# Patient Record
Sex: Male | Born: 1945 | Race: White | Hispanic: No | Marital: Married | Smoking: Former smoker
Health system: Southern US, Community
[De-identification: ages and names within clinical notes are randomized; demographics above are authoritative.]

## PROBLEM LIST (undated history)

## (undated) DIAGNOSIS — M1990 Unspecified osteoarthritis, unspecified site: Secondary | ICD-10-CM

## (undated) DIAGNOSIS — Z86010 Personal history of colonic polyps: Secondary | ICD-10-CM

## (undated) DIAGNOSIS — I10 Essential (primary) hypertension: Secondary | ICD-10-CM

## (undated) HISTORY — DX: Personal history of colonic polyps: Z86.010

## (undated) HISTORY — PX: COLONOSCOPY: SHX174

## (undated) HISTORY — DX: Essential (primary) hypertension: I10

## (undated) HISTORY — PX: KNEE SURGERY: SHX244

## (undated) HISTORY — DX: Unspecified osteoarthritis, unspecified site: M19.90

---

## 2003-07-07 ENCOUNTER — Other Ambulatory Visit: Payer: Self-pay

## 2004-08-24 ENCOUNTER — Ambulatory Visit: Payer: Self-pay | Admitting: General Practice

## 2005-09-14 ENCOUNTER — Ambulatory Visit: Payer: Self-pay | Admitting: General Surgery

## 2007-05-08 DIAGNOSIS — I10 Essential (primary) hypertension: Secondary | ICD-10-CM

## 2007-05-08 HISTORY — DX: Essential (primary) hypertension: I10

## 2009-11-25 ENCOUNTER — Ambulatory Visit: Payer: Self-pay | Admitting: Internal Medicine

## 2010-09-26 ENCOUNTER — Ambulatory Visit: Payer: Self-pay | Admitting: General Surgery

## 2010-09-28 LAB — PATHOLOGY REPORT

## 2011-06-18 ENCOUNTER — Ambulatory Visit: Payer: Self-pay | Admitting: Internal Medicine

## 2012-11-20 ENCOUNTER — Encounter: Payer: Self-pay | Admitting: *Deleted

## 2013-04-29 ENCOUNTER — Ambulatory Visit: Payer: Self-pay | Admitting: General Practice

## 2013-04-29 DIAGNOSIS — I10 Essential (primary) hypertension: Secondary | ICD-10-CM

## 2013-04-29 LAB — CBC
HCT: 45.1 % (ref 40.0–52.0)
HGB: 14.6 g/dL (ref 13.0–18.0)
MCH: 28.9 pg (ref 26.0–34.0)
Platelet: 264 10*3/uL (ref 150–440)
RBC: 5.05 10*6/uL (ref 4.40–5.90)
WBC: 9.2 10*3/uL (ref 3.8–10.6)

## 2013-04-29 LAB — BASIC METABOLIC PANEL
Anion Gap: 3 — ABNORMAL LOW (ref 7–16)
BUN: 14 mg/dL (ref 7–18)
Calcium, Total: 8.6 mg/dL (ref 8.5–10.1)
Co2: 30 mmol/L (ref 21–32)
EGFR (Non-African Amer.): >60
Glucose: 104 mg/dL — ABNORMAL HIGH (ref 65–99)
Osmolality: 269 (ref 275–301)
Potassium: 4 mmol/L (ref 3.5–5.1)
Sodium: 134 mmol/L — ABNORMAL LOW (ref 136–145)

## 2013-04-29 LAB — URINALYSIS, COMPLETE
Bacteria: NONE SEEN
Glucose,UR: NEGATIVE mg/dL (ref 0–75)
Hyaline Cast: 1
Ketone: NEGATIVE
Nitrite: NEGATIVE
Ph: 6 (ref 4.5–8.0)
RBC,UR: 10 /HPF (ref 0–5)
Specific Gravity: 1.01 (ref 1.003–1.030)
Squamous Epithelial: 1
WBC UR: 1 /HPF (ref 0–5)

## 2013-04-29 LAB — PROTIME-INR: INR: 0.9

## 2013-04-29 LAB — APTT: Activated PTT: 33.3 secs (ref 23.6–35.9)

## 2013-05-11 ENCOUNTER — Inpatient Hospital Stay: Payer: Self-pay | Admitting: General Practice

## 2013-05-12 LAB — BASIC METABOLIC PANEL
ANION GAP: 3 — AB (ref 7–16)
BUN: 12 mg/dL (ref 7–18)
Calcium, Total: 8.2 mg/dL — ABNORMAL LOW (ref 8.5–10.1)
Chloride: 100 mmol/L (ref 98–107)
Co2: 28 mmol/L (ref 21–32)
Creatinine: 0.96 mg/dL (ref 0.60–1.30)
GLUCOSE: 109 mg/dL — AB (ref 65–99)
OSMOLALITY: 263 (ref 275–301)
Potassium: 3.9 mmol/L (ref 3.5–5.1)
Sodium: 131 mmol/L — ABNORMAL LOW (ref 136–145)

## 2013-05-12 LAB — HEMOGLOBIN: HGB: 12.1 g/dL — AB (ref 13.0–18.0)

## 2013-05-12 LAB — PLATELET COUNT: PLATELETS: 203 10*3/uL (ref 150–440)

## 2013-05-13 LAB — BASIC METABOLIC PANEL
Anion Gap: 2 — ABNORMAL LOW (ref 7–16)
BUN: 11 mg/dL (ref 7–18)
CALCIUM: 8.4 mg/dL — AB (ref 8.5–10.1)
CO2: 32 mmol/L (ref 21–32)
CREATININE: 1.05 mg/dL (ref 0.60–1.30)
Chloride: 102 mmol/L (ref 98–107)
EGFR (African American): >60
EGFR (Non-African Amer.): >60
GLUCOSE: 107 mg/dL — AB (ref 65–99)
OSMOLALITY: 272 (ref 275–301)
Potassium: 3.8 mmol/L (ref 3.5–5.1)
Sodium: 136 mmol/L (ref 136–145)

## 2013-05-13 LAB — HEMOGLOBIN: HGB: 12.6 g/dL — ABNORMAL LOW (ref 13.0–18.0)

## 2013-05-13 LAB — PLATELET COUNT: PLATELETS: 207 10*3/uL (ref 150–440)

## 2014-03-23 ENCOUNTER — Ambulatory Visit: Payer: Self-pay | Admitting: General Practice

## 2014-03-23 LAB — CBC
HCT: 43 % (ref 40.0–52.0)
HGB: 14.6 g/dL (ref 13.0–18.0)
MCH: 30.6 pg (ref 26.0–34.0)
MCHC: 33.9 g/dL (ref 32.0–36.0)
MCV: 90 fL (ref 80–100)
Platelet: 254 10*3/uL (ref 150–440)
RBC: 4.77 10*6/uL (ref 4.40–5.90)
RDW: 13.6 % (ref 11.5–14.5)
WBC: 7.6 10*3/uL (ref 3.8–10.6)

## 2014-03-23 LAB — URINALYSIS, COMPLETE
BACTERIA: NONE SEEN
Bilirubin,UR: NEGATIVE
Blood: NEGATIVE
Glucose,UR: NEGATIVE mg/dL (ref 0–75)
Ketone: NEGATIVE
Leukocyte Esterase: NEGATIVE
NITRITE: NEGATIVE
Ph: 7 (ref 4.5–8.0)
Protein: NEGATIVE
Specific Gravity: 1.006 (ref 1.003–1.030)
Squamous Epithelial: <1
WBC UR: <1 /HPF (ref 0–5)

## 2014-03-23 LAB — PROTIME-INR
INR: 1
Prothrombin Time: 13.2 secs (ref 11.5–14.7)

## 2014-03-23 LAB — BASIC METABOLIC PANEL
Anion Gap: 6 — ABNORMAL LOW (ref 7–16)
BUN: 17 mg/dL (ref 7–18)
Calcium, Total: 9.1 mg/dL (ref 8.5–10.1)
Chloride: 102 mmol/L (ref 98–107)
Co2: 28 mmol/L (ref 21–32)
Creatinine: 1.13 mg/dL (ref 0.60–1.30)
EGFR (African American): >60
GLUCOSE: 107 mg/dL — AB (ref 65–99)
OSMOLALITY: 274 (ref 275–301)
POTASSIUM: 4.1 mmol/L (ref 3.5–5.1)
SODIUM: 136 mmol/L (ref 136–145)

## 2014-03-23 LAB — SEDIMENTATION RATE: ERYTHROCYTE SED RATE: 10 mm/h (ref 0–20)

## 2014-03-23 LAB — APTT: Activated PTT: 31 secs (ref 23.6–35.9)

## 2014-03-23 LAB — MRSA PCR SCREENING

## 2014-03-24 LAB — URINE CULTURE

## 2014-04-07 ENCOUNTER — Inpatient Hospital Stay: Payer: Self-pay | Admitting: General Practice

## 2014-04-08 LAB — BASIC METABOLIC PANEL
Anion Gap: 9 (ref 7–16)
BUN: 16 mg/dL (ref 7–18)
CALCIUM: 7.8 mg/dL — AB (ref 8.5–10.1)
Chloride: 96 mmol/L — ABNORMAL LOW (ref 98–107)
Co2: 26 mmol/L (ref 21–32)
Creatinine: 1.13 mg/dL (ref 0.60–1.30)
EGFR (Non-African Amer.): >60
Glucose: 155 mg/dL — ABNORMAL HIGH (ref 65–99)
OSMOLALITY: 267 (ref 275–301)
POTASSIUM: 3.6 mmol/L (ref 3.5–5.1)
Sodium: 131 mmol/L — ABNORMAL LOW (ref 136–145)

## 2014-04-08 LAB — PLATELET COUNT: Platelet: 220 10*3/uL (ref 150–440)

## 2014-04-08 LAB — HEMOGLOBIN: HGB: 12.2 g/dL — ABNORMAL LOW (ref 13.0–18.0)

## 2014-04-09 LAB — BASIC METABOLIC PANEL
ANION GAP: 9 (ref 7–16)
BUN: 12 mg/dL (ref 7–18)
Calcium, Total: 7.8 mg/dL — ABNORMAL LOW (ref 8.5–10.1)
Chloride: 97 mmol/L — ABNORMAL LOW (ref 98–107)
Co2: 26 mmol/L (ref 21–32)
Creatinine: 1.18 mg/dL (ref 0.60–1.30)
EGFR (African American): >60
EGFR (Non-African Amer.): >60
GLUCOSE: 167 mg/dL — AB (ref 65–99)
Osmolality: 268 (ref 275–301)
Potassium: 3.4 mmol/L — ABNORMAL LOW (ref 3.5–5.1)
Sodium: 132 mmol/L — ABNORMAL LOW (ref 136–145)

## 2014-04-09 LAB — PLATELET COUNT: Platelet: 197 10*3/uL (ref 150–440)

## 2014-04-09 LAB — HEMOGLOBIN: HGB: 11.6 g/dL — ABNORMAL LOW (ref 13.0–18.0)

## 2014-04-10 LAB — BASIC METABOLIC PANEL
ANION GAP: 5 — AB (ref 7–16)
BUN: 12 mg/dL (ref 7–18)
CALCIUM: 7.8 mg/dL — AB (ref 8.5–10.1)
Chloride: 101 mmol/L (ref 98–107)
Co2: 27 mmol/L (ref 21–32)
Creatinine: 1.02 mg/dL (ref 0.60–1.30)
EGFR (African American): >60
Glucose: 131 mg/dL — ABNORMAL HIGH (ref 65–99)
OSMOLALITY: 268 (ref 275–301)
POTASSIUM: 3.7 mmol/L (ref 3.5–5.1)
Sodium: 133 mmol/L — ABNORMAL LOW (ref 136–145)

## 2014-08-28 NOTE — Op Note (Signed)
PATIENT NAME:  Jerry Walls, PINN MR#:  [id number] DATE OF BIRTH:  [birth year]  DATE OF PROCEDURE:  05/11/2013  PREOPERATIVE DIAGNOSIS: Degenerative arthrosis of the left knee.   POSTOPERATIVE DIAGNOSIS: Degenerative arthrosis of the left knee.   PROCEDURE PERFORMED: Left total knee arthroplasty using computer-assisted navigation.   SURGEON: Laurice Record. Hooten, MD   ASSISTANT: Vance Peper, PA (required to maintain retraction throughout the procedure).   ANESTHESIA: Spinal.   ESTIMATED BLOOD LOSS: 50 mL.   FLUIDS REPLACED: 1800 mL of crystalloid.   TOURNIQUET TIME: 98 minutes.   DRAINS: Two medium drains to reinfusion system.   SOFT TISSUE RELEASES: Anterior cruciate ligament, posterior cruciate ligament, deep and superficial medial collateral ligament, and patellofemoral ligament.   IMPLANTS UTILIZED: DePuy PFC Sigma size 5 posterior stabilized femoral component (cemented), size 5 MBT tibial component (cemented), 38 mm 3-peg oval dome patella (cemented), and a 10 mm stabilized rotating platform polyethylene insert.   INDICATIONS FOR SURGERY: The patient is a 69 year old gentleman who has been seen for complaints of progressive left knee pain. X-rays demonstrated severe degenerative changes in tricompartmental fashion with relative varus deformity. After discussion of the risks and benefits of surgical intervention, the patient expressed understanding of the risks and benefits and agreed with plans for surgical intervention.   PROCEDURE IN DETAIL: The patient was brought into the operating room and after adequate spinal anesthesia was achieved, a tourniquet was placed on the patient's upper left thigh. The patient's left knee and leg were cleaned and prepped with alcohol and DuraPrep and draped in the usual sterile fashion. A "timeout" was performed as per usual protocol. The left lower extremity was exsanguinated using an Esmarch, and the tourniquet was inflated to 300 mmHg. An anterior  longitudinal incision was made followed by a standard mid vastus approach. A moderate effusion was evacuated. The deep fibers of the medial collateral ligament were elevated in a subperiosteal fashion off the medial flare of the tibia so as to maintain a continuous soft tissue sleeve. The patella was subluxed laterally and the patellofemoral ligament was incised. Inspection of the knee demonstrated severe degenerative changes with full-thickness loss of articular cartilage to the medial compartment. Prominent osteophytes were debrided using a rongeur. Anterior and posterior cruciate ligaments were excised. Two 4.0 mm Schanz pins were inserted into the femur and into the tibia for attachment of the array of trackers used for computer-assisted navigation. Hip center was identified using circumduction technique. Distal landmarks were mapped using the computer. The distal femur and proximal tibia were mapped using the computer. Distal femoral cutting guide was then positioned using computer-assisted navigation so as to achieve a 5 degree distal valgus cut. Cut was performed and verified using the computer. Distal femur was sized and it was felt that a size 5 femoral component was appropriate. A size 5 cutting guide was positioned and the anterior cut was performed and verified using the computer. This was followed by completion of the posterior and chamfer cuts. Femoral cutting guide for central box was then positioned, and a central box cut was performed. Attention was then directed to the proximal tibia. Medial and lateral menisci were excised. The extramedullary tibial cutting guide was positioned using computer-assisted navigation so as to achieve 0 degree varus-valgus alignment and 0 degree posterior slope. Cut was performed and verified using the computer. The proximal tibia was sized and it was felt that a size 5 tibial tray was appropriate. Tibial and femoral trials were inserted followed by debridement of  prominent posterior osteophytes off of the femoral condyles. A 10 mm polyethylene insert was placed and the knee was placed through a range of motion. The knee was felt to be tight medially. A Cobb elevator was used to elevate the superficial fibers of the medial collateral ligament. This allowed for excellent medial and lateral soft tissue balancing both in full extension and in flexion. Finally, the patella was cut and prepared so as to accommodate a 38 mm 3-peg oval dome patella. Patellar trial was placed and the knee was placed through a range of motion with excellent patellar tracking appreciated. The femoral trial was removed. Central post hole for the tibial component was reamed followed by insertion of a keel punch. Tibial trials were then removed. The cut surfaces of bone were irrigated with copious amounts of normal saline with antibiotic solution using pulsatile lavage and then suctioned dry. Polymethylmethacrylate cement was prepared in the usual fashion using a vacuum mixer. Cement was applied to the cut surface of the proximal tibia as well as to the undersurface of a size 5 MBT tibial component. The tibial component was positioned and impacted into place. Excess cement was removed using [employer] fast streamer. Cement was then applied to cut surfaces of the femur as well as along the posterior flanges of a size 5 posterior stabilized femoral component. Femoral component was positioned and impacted into place. Excess cement was removed using [employer] fast streamer. A 10 mm polyethylene trial was placed and the knee was brought into full extension with steady axial compression applied. Finally, cement was applied to the backside of a 38 mm 3-peg oval dome patella and patellar component was positioned and patellar clamp applied. Excess cement was removed using [employer] fast streamer.   After adequate curing of cement, the tourniquet was deflated after a total tourniquet time of 98 minutes. Hemostasis was achieved using  electrocautery. The knee was irrigated with copious amounts of normal saline with antibiotic solution using pulsatile lavage and then suctioned dry. The knee was inspected for any residual cement debris. 20 mL of 1.3% Exparel in 40 mL of normal saline was injected along the posterior capsule, medial and lateral gutters, and along the arthrotomy site. A 10 mm stabilized rotating platform polyethylene insert was inserted and the knee was placed through a range of motion with excellent medial and lateral soft tissue balance appreciated and excellent patellar tracking appreciated. Two medium drains were placed in the wound bed and brought out through a separate stab incision to be attached to a reinfusion system. The medial parapatellar portion of the incision was reapproximated using interrupted sutures of #1 Vicryl. The subcutaneous tissue was injected along the incision site with 0.25% Marcaine with epinephrine. The subcutaneous tissue was then approximated in layers using first #0 Vicryl followed by 2-0 Vicryl. The skin was closed with skin staples. A sterile dressing was applied.  The patient tolerated the procedure well. He was transported to the recovery room in stable condition.    ____________________________ Laurice Record. Holley Bouche., MD jph:jcm D: 05/11/2013 19:57:14 ET T: 05/11/2013 20:35:56 ET JOB#: [id number]  cc: Laurice Record. Holley Bouche., MD, <Dictator> JAMES P Holley Bouche MD ELECTRONICALLY SIGNED 05/24/2013 23:41

## 2014-08-28 NOTE — Op Note (Signed)
PATIENT NAME:  Jerry Walls, Jerry Walls MR#:  [id number] DATE OF BIRTH:  31-Dec-1945  DATE OF PROCEDURE:  04/07/2014  PREOPERATIVE DIAGNOSIS: Degenerative arthrosis of the right knee (primary).   POSTOPERATIVE DIAGNOSIS: Degenerative arthrosis of the right knee (primary).   PROCEDURE PERFORMED: Right total knee arthroplasty using computer-assisted navigation.   SURGEON: Laurice Record. Holley Bouche., MD.   ASSISTANTMarland Kitchen Vance Peper, PA (required to maintain retraction throughout the procedure).   ANESTHESIA: Spinal.   ESTIMATED BLOOD LOSS: 50 mL.   FLUIDS REPLACED: 1700 mL of crystalloid.   TOURNIQUET TIME: 114 minutes.   DRAINS: Two medium drains to reinfusion system.   SOFT TISSUE RELEASES: Anterior cruciate ligament, posterior cruciate ligament, deep and superficial medial collateral ligament, and patellofemoral ligament.   IMPLANTS UTILIZED: DePuy Sigma size 5 posterior stabilized femoral component (cemented), size 5 MBT tibial component (cemented), 41 mm 3-peg oval dome patella (cemented), and a 10 mm stabilized rotating platform polyethylene insert.   INDICATIONS FOR SURGERY: The patient is a 69 year old male who has been seen for complaints of progressive right knee pain and decreased range of motion. X-rays demonstrated severe degenerative changes in tricompartmental fashion with varus deformity. After discussion of the risks and benefits of surgical intervention, the patient expressed understanding of the risks and benefits, and agreed with plans for surgical intervention.   PROCEDURE IN DETAIL: The patient was brought to the Operating Room and, after adequate spinal anesthesia was achieved, a tourniquet was placed on the patient's upper right thigh. The patient's right knee and leg were cleaned and prepped with alcohol and DuraPrep, draped in the usual sterile fashion. A "timeout" was performed as per usual protocol. The right lower extremity was exsanguinated using an Esmarch, and the tourniquet was  inflated to 300 mmHg.   An anterior and longitudinal incision was made followed by a standard mid vastus approach. A large effusion was evacuated. The deep fibers of the medial collateral ligament were elevated in a subperiosteal fashion off the medial flare of the tibia so as to maintain a continuous soft tissue sleeve. The patella was subluxed laterally and the patellofemoral ligament was incised. Osteophytes were debrided off the patella. Inspection of the knee demonstrated severe degenerative changes in tricompartmental fashion with full-thickness loss of articular cartilage and some erosive changes to the medial tibial plateau. Prominent osteophytes were debrided using a rongeur. Anterior and posterior cruciate ligaments were excised. Two 4.0 mm Schanz pins were inserted into the femur and into the tibia for attachment of the array of trackers used for computer-assisted navigation. Hip center was identified using circumduction technique. Distal landmarks were mapped using the computer. The distal femur and proximal tibia were mapped using the computer. Distal femoral cutting guide was positioned using computer-assisted navigation so as to achieve a 5 degree distal valgus cut. The cut was performed and verified using the computer. The distal femur was sized and it was felt that a size 5 femoral component was appropriate. A size 5 cutting guide was positioned and the anterior cut was performed and verified using the computer. This was followed by completion of the posterior and chamfer cuts. Femoral cutting guide for the central box was then positioned and the central box cut was performed.   Attention was then directed to the proximal tibia. Medial and lateral menisci were excised. The extramedullary tibial cutting guide was positioned using computer-assisted navigation so as to achieve 0 degree varus valgus alignment and 0 degree posterior slope. Cut was performed and verified using the computer. A  prominent posterior osteophyte was noted and this was debrided with the cut portion of the proximal tibia. Several relatively large osteochondral loose bodies were removed from the popliteal region. The proximal tibia was sized and it was felt that a size 5 tibial tray was appropriate. Tibial and femoral trials were inserted followed by insertion of a 10 mm polyethylene trial. The knee was felt to be tight medially. A Cobb elevator was used to elevate the superficial fibers of the medial collateral ligament. This allowed for excellent mediolateral soft tissue balancing both in full extension and in flexion. Finally, the patella was cut and prepared so as to accommodate a 41 mm 3-peg oval dome patella. Patellar trial was placed and the knee was placed through a range of motion with excellent patellar tracking appreciated. Greater than 120 degrees of flexion was achieved without difficulty. Femoral component was removed after debridement of the osteophytes off the posterior condyles. Central post hole for the tibial component was reamed followed by insertion of a keel punch. Tibial trials were then removed. The cut surfaces of bone were irrigated with copious amounts of normal saline with antibiotic solution using pulsatile lavage and then suctioned dry. Polymethyl methacrylate cement was prepared in the usual fashion using a vacuum mixer. Cement was applied to the cut surface of the proximal tibia as well as along the undersurface of a size 5 MBT tibial component. The tibial component was positioned and impacted into place. Excess cement was removed using freer elevators. Cement was then applied to the cut surface of the femur as well as on the posterior flanges of a size 5 posterior stabilized femoral component. Femoral component was positioned and impacted into place. Excess cement was removed using freer elevators. A 10 mm polyethylene trial was inserted and the knee was brought into full extension with steady  axial compression applied. Finally, cement was applied to the backside of a 41 mm 3-peg oval dome patella, and the patellar component was positioned and patellar clamp applied. Excess cement was removed using freer elevators.   After adequate curing of cement, the tourniquet was deflated after a total tourniquet time of 114 minutes. Hemostasis was achieved using electrocautery. The knee was irrigated with copious amounts of normal saline with antibiotic solution using pulsatile lavage and then suctioned dry. The knee was inspected for any residual cement debris. Then, 20 mL of 1.3% Exparel in 40 mL of normal saline was injected along the posterior capsule, medial and lateral gutters, and along the arthrotomy site. A 10 mm stabilized rotating platform polyethylene insert was inserted and the knee was placed through a range of motion with excellent mediolateral soft tissue balancing and excellent patellar tracking appreciated. Two medium drains were placed in the wound bed and brought out through a separate stab incision to be attached to a reinfusion system. The medial parapatellar portion of the incision was reapproximated using interrupted sutures of #1 Vicryl. The subcutaneous tissue was approximated in layers using first #0 Vicryl followed by #2-0 Vicryl. Then, 30 mL of 0.25% Marcaine with epinephrine was injected along the subcutaneous tissue in line with the skin incision. The skin was reapproximated using skin staples. A sterile dressing was applied.   The patient tolerated the procedure well. He was transported to the recovery room in stable condition.    ____________________________ Laurice Record. Holley Bouche., MD jph:at D: 04/08/2014 06:54:24 ET T: 04/08/2014 13:02:31 ET JOB#: [id number]  cc: Laurice Record. Holley Bouche., MD, <Dictator> JAMES P Holley Bouche MD ELECTRONICALLY SIGNED  04/09/2014 12:57 

## 2014-08-28 NOTE — Discharge Summary (Signed)
PATIENT NAME:  Jerry Walls, Jerry Walls MR#:  [id number] DATE OF BIRTH:  23-Jan-1946  DATE OF ADMISSION:  05/11/2013 DATE OF DISCHARGE:  05/14/2013  ADMITTING DIAGNOSIS: Degenerative arthrosis of the left knee.   DISCHARGE DIAGNOSES:  1.  Degenerative arthrosis of the left knee.  2.  Increased blood pressure, controlled with adjustment of medication.   CONSULTATION: Hospitalist, Dr. Anselm Jungling.   HISTORY: The patient is a 69 year old gentleman, who has a several year history of progressive left knee pain. The symptoms have been essentially increasing in severity over the course of the last several months. He has reported increased pain to the medial aspect of the knee. His pain was aggravated with weight-bearing activities. The patient had reported some near giving way of the knee as well as activity-related swelling. He was having difficulty with ascending and descending stairs as well as arising from a sitting position. The pain increased to a point that it was significantly interfering with his activities of daily living. X-rays taken in [place] showed severe tricompartmental degenerative arthrosis. He was noted to have bone-on-bone to the medial compartment space. There was some slight varus alignment noted. Subchondral sclerosis as well as osteophyte formation was noted. After discussion of the risks and benefits of surgical intervention, the patient expressed his understanding of the risks and benefits and agreed for plans for surgical intervention.   PROCEDURE: Left total knee arthroplasty using computer-assisted navigation.   ANESTHESIA: Spinal.   SOFT TISSUE RELEASE: Anterior cruciate ligament, posterior cruciate ligament, deep and superficial medial collateral ligaments, as well as patellofemoral ligament.   IMPLANTS UTILIZED: DePuy PFC Sigma size 5 posterior stabilized femoral component (cemented), size 5 MBT tibial component (cemented), 38 mm, 3-peg oval dome patella  (cemented), and a 10 mm stabilized rotating platform polyethylene insert.   HOSPITAL COURSE: The patient tolerated the procedure very well. He had no complications. He was then taken to the PACU where he was stabilized and then transferred to the orthopedic floor. He began receiving anticoagulation therapy of Lovenox 30 mg subcutaneous q.12 hours per anesthesia and pharmacy protocol. He was fitted with TED stockings bilaterally. These were allowed to be removed 1 hour per 8 hour shift. The left one was final day 2 following removal of the Hemovac and dressing change. The patient was also fitted with AVI compression foot pumps bilaterally set at 80 mmHg. His calves have been nontender. He has had negative Homans sign. There has been no evidence of any DVTs. Heels were elevated off the bed using rolled towels.   The patient has denied any chest pain or shortness of breath. Vital signs have been stable. His blood pressure, as stated earlier, was elevated during his hospital course and subsequently a medical consult was obtained and blood pressure medicine was adjusted and he has had no complications. He was asymptomatic during this time. The patient has been stable. No transfusions were needed other than the Autovac transfusion given the first 6 hours postoperatively.   The patient began receiving physical therapy on day 1 for gait training and transfers. He has been doing extremely well. Upon being discharged, he was ambulating greater than 200 feet. He was able go up and down 4 sets of steps. He was independent with bed to chair transfers. Occupational therapy was also initiated on day 1 for ADLs and assistive devices.   The patient's IV, Foley and Hemovac were discontinued on day 2 along with a dressing change. The wound was free of any drainage or signs  of infection. Polar Care was reapplied to the surgical leg maintaining a temperature of 40 to 50 degrees Fahrenheit.   DISPOSITION: The patient is being  discharged to home in improved stable condition.   DISCHARGE INSTRUCTIONS: He can continue weight-bearing as tolerated. Continue using a walker until cleared by physical therapy to go to a quad cane. Elevate heels off the bed.  Continue TED stockings bilaterally. These may be removed at night, but are to be worn during the day. Continue Polar Care maintaining a temperature of 40 to 50 degrees Fahrenheit. Incentive spirometer q.1 hour while awake. Encourage cough and deep breathing q.2 hours while awake. He is placed on a regular diet. He has a followup appointment with [facility] on January 20 at 8:30. He is to call the clinic sooner if any temperatures of 101.5 or greater or excessive bleeding.   The patient is to continue taking his medications that he was taking prior to admission. However, we did recommend that he contact Dr. Hall Busing to have his blood pressure re-evaluated. Also recommended the family begin taking his blood pressure at home on a regular basis and make a record of this. Also, contact Dr. Hall Busing for Nicoderm patches 14 mg since this seems to work for him here in the hospital. He was given a prescription for oxycodone 5 to 10 mg q.4 to 6 hours p.r.n. for pain, Ultram 50 to 100 mg q.4 to 6 hours p.r.n. for pain and Lovenox 40 mg subcutaneously daily for 14 days, then discontinue and begin taking one 81 mg enteric-coated aspirin.   PAST MEDICAL HISTORY:  1.  Positive for chickenpox. 2.  Gout. 3.  Hypertension. 4.  Hemorrhoids.  ____________________________ Vance Peper, PA [id number] D: 05/14/2013 07:46:51 ET T: 05/14/2013 08:18:30 ET JOB#: [id number]  cc: Vance Peper, PA, <Dictator> Montrae Braithwaite PA ELECTRONICALLY SIGNED 05/15/2013 8:06

## 2014-08-28 NOTE — Discharge Summary (Signed)
PATIENT NAME:  Jerry Walls, Jerry Walls MR#:  [id number] DATE OF BIRTH:  [birth year]  DATE OF ADMISSION:  04/07/2014 DATE OF DISCHARGE:  04/10/2014  ADMITTING DIAGNOSIS:  Degenerative arthrosis of the right knee.   DISCHARGE DIAGNOSIS:  Degenerative arthrosis of the right knee.   OPERATION:  On 04/07/2014, the patient had a right total knee arthroplasty using computer-aided navigation.   SURGEON:  Laurice Record. Holley Bouche., MD   ASSISTANT:  Vance Peper, PA   ANESTHESIA:  Spinal.   IMPLANTS:  DePuy Sigma size 5 posterior stabilized femoral component cemented, size 5 MBT tibial component cemented, 41 mm 3 peg oval dome patella cemented, 10 mm stabilized rotating platform polyethylene insert. The patient was stabilized, brought to the recovery room, and then brought down to the orthopedic floor for physical therapy and pain control.   HISTORY:  The patient is a 69 year old male, who presented for upcoming total knee replacement. The patient has been refractory to conservative treatments, continued to have pain and having difficulties with normal activities throughout the day.   PHYSICAL EXAMINATION:  GENERAL:  Alert male with discomfort with transfers.  CARDIOVASCULAR:  Regular rate and rhythm with no murmurs.  LUNGS:  Clear to auscultation.  MUSCULOSKELETAL:  In regard to the right knee, the patient has mild swelling with medial joint line pain. The patient has good stability. The patient has -10 degrees of extension and 100 degrees of flexion. The patient has x-rays revealing bone-on-bone pathology involving the right knee.   HOSPITAL COURSE:  After initial admission on 04/07/2014, the patient was brought to the orthopedic floor and had labs drawn initially with a hemoglobin of 12.2, which dropped down to 11.6. The patient did physical therapy and was ambulating up to 425 feet with no real difficulty, including stairs. The patient is ready to go home with home health physical therapy on April 10, 2014.    CONDITION AT DISCHARGE:  Stable.   DISPOSITION:  The patient was sent home with home health physical therapy.   DISCHARGE INSTRUCTIONS:  The patient will do weight bear as tolerated on the affected leg. The patient will elevate his leg with 1 to 2 pillows and use thigh-high TED hose on both legs, removed at bedtime. The patient will elevate his heels off the bed. The patient will use an incentive spirometer and be encouraged to do cough and deep breathing. The patient will use a regular diet. The patient is to use a Polar Care to decrease swelling, try to keep his dressing clean and dry and try not to get it wet. The patient will call the clinic if there is any bright red bleeding, calf pain, bowel or bowel or bladder difficulty or any fever greater than 101.5. The patient will do home health physical therapy working on gait training and range of motion activities. The patient will follow up on 04/22/2014 for staple removal.   DISCHARGE MEDICATIONS: Resume home medications and use Ultram 50 mg 1 to 2 tablets every 4 to 6 hours p.r.n. for pain, oxycodone 5 mg 1 tablet q. 4 to 6 hours p.r.n. for severe pain as well as to use Lovenox 40 mg subcutaneous once a day for 14 days.     ____________________________ Lenna Sciara. Reche Dixon, [place] jtm:ap D: 04/10/2014 07:21:20 ET T: 04/10/2014 08:34:04 ET JOB#: [id number]  cc: J. Reche Dixon, [place], <Dictator> J Chole Driver [facility] PA ELECTRONICALLY SIGNED 04/13/2014 9:57

## 2014-08-28 NOTE — Consult Note (Signed)
Brief Consult Note: Diagnosis: Hypertensive Urgency.   Patient was seen by consultant.   Consult note dictated.   Orders entered.   Comments: Will continue to follow.  Electronic Signatures: Vaughan Basta (MD)  (Signed 06-Jan-15 17:43)  Authored: Brief Consult Note   Last Updated: 06-Jan-15 17:43 by Vaughan Basta (MD)

## 2014-08-28 NOTE — Consult Note (Signed)
PATIENT NAME:  Jerry Walls, ROOTS MR#:  [id number] DATE OF BIRTH:  May 12, 1945   DATE OF CONSULTATION:  05/12/2013  REFERRING PHYSICIAN:  Laurice Record. Holley Bouche., MD CONSULTING PHYSICIAN:  Ceasar Lund. Anselm Jungling, MD  REASON FOR CONSULTATION: Hypertensive management.   HISTORY OF PRESENT ILLNESS: A 69 year old male with history of hypertension and gout who was admitted to orthopedic service and status post total left knee replacement surgery for arthritis and noticed to be blood pressure remaining very high, more than 200 today, and so medical consult was called. I saw the patient and on further questioning, he denies any headache, feeling any chest pain or shortness of breath. He denies any palpitation or dizziness and his pain is fairly under control from his surgery. He was taking blood pressure medicine in the past regularly and his blood pressure was remaining fairly under control for that.   REVIEW OF SYSTEMS: CONSTITUTIONAL: Negative for fever, fatigue, weakness, pain or weight loss.  EYES: No blurring, double vision, discharge or redness.  EARS, NOSE, THROAT: No tinnitus, ear pain or hearing loss.  RESPIRATORY: No cough, wheezing, hemoptysis or shortness of breath.  CARDIOVASCULAR: No chest pain, orthopnea, edema, arrhythmia or palpitation.  GASTROINTESTINAL: No nausea, vomiting, diarrhea or abdominal pain.  GENITOURINARY: No dysuria, hematuria or increased frequency.  ENDOCRINE: No increased sweating. No heat or cold intolerance.  SKIN: No acne, rashes or lesions.  MUSCULOSKELETAL: No pain or swelling in the joints except left knee where the surgery was done yesterday.  NEUROLOGICAL: No numbness, weakness, tremor or vertigo.  PSYCHIATRIC: No anxiety, insomnia, bipolar disorder.   PAST MEDICAL HISTORY:  1.  Hypertension.  2.  Gout.  PAST SURGICAL HISTORY:  1.  Right knee surgery for ligamental injury a few years ago.  2.  Left knee surgery for total knee replacement yesterday.    SOCIAL HISTORY: He lives with family. He is a smoker of 1-1/2 packs every day and he drinks 4 to 5 beers every day. Sometimes he goes without drinking for a few days and does not go into withdrawal. Denies any illegal drug use. 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   FAMILY HISTORY: Positive for stroke in father.   HOME MEDICATIONS: 1.  Magnesium oxide 400 mg oral tablet once a day.  2.  Hydrochlorothiazide and irbesartan 12.5 plus 300 mg oral tablet once a day.  3.  Centrum antioxidant multivitamin tablet once a day.  4.  Allopurinol 100 mg oral once a day.   PHYSICAL EXAMINATION: VITAL SIGNS: Currently: Temperature 97.7, pulse 83, respirations 18, blood pressure was running up to 219/72 and currently came down to 185/77, pulse ox 97% on room air.  GENERAL: The patient is fully alert and oriented to time, place and person. Does not appear in any acute distress.  HEENT: Atraumatic. Conjunctivae pink. Oral mucosa moist.  NECK: Supple. No JVD.  RESPIRATORY: Bilateral clear and equal air entry.  CARDIOVASCULAR: S1, S2 present, regular. No murmur.  ABDOMEN: Soft, nontender. Bowel sounds present. No organomegaly.  SKIN: No rashes.  LEGS: No edema.  NEUROLOGICAL: Power 5/5. Follows commands. Moves all 4 limbs except left lower limb because of surgery.  JOINTS: No swelling or tenderness, except left knee.  PSYCHIATRIC: Does not appear in any acute psychiatric illness at this time.   IMPORTANT LABORATORY AND DIAGNOSTIC RESULTS: Glucose 109, BUN 12,  sodium 131, potassium 3.9, chloride 100, CO2 of 28, calcium 8.2. Platelet count 203. Hemoglobin 12.1. EKG revealed normal sinus rhythm.   ASSESSMENT  AND PLAN: A 69 year old male who is status post surgery, left knee replacement, day 1.  Medical consult for hypertensive urgency.  1.  Hypertensive urgency. Gave him injection hydralazine 10 mg and then labetalol 5 mg over the last 3 hours. Blood pressure responded slightly; now it is 185/90. Will give  injection Lasix, as the patient was on IV fluids since yesterday and might be in the blood pressure higher range as a response of that. Stop IV fluids and will give IV Lasix 40 mg injection 1 dose. The patient was taking hydrochlorothiazide and irbesartan at home, received over here, not much help.  Will add amlodipine 10 mg oral dose, first dose now for better control of his blood pressure.  2.  Gout.  Recommend continue using allopurinol as he was taking.  3.  Hypomagnesemia and cramps. Recommend continuing magnesium as he was taking at home.  4.  Status post knee replacement surgery. Pain control and deep vein thrombosis prophylaxis as per orthopedic and physical therapy.  5.  Smoking. Smoking cessation counseling done for 5 minutes and encouraged him to stop smoking from now onward. He is on nicotine patch already.   6.  CODE STATUS:  FULL CODE.   TOTAL TIME SPENT ON THIS CONSULT: 50 minutes.   We will continue following for better blood pressure control.   ____________________________ Ceasar Lund. Anselm Jungling, MD vgv:cs D: 05/12/2013 17:51:00 ET T: 05/12/2013 18:05:33 ET JOB#: [id number]  cc: Ceasar Lund. Anselm Jungling, MD, <Dictator>    Vaughan Basta MD ELECTRONICALLY SIGNED 05/13/2013 22:23

## 2015-06-21 ENCOUNTER — Encounter: Payer: Self-pay | Admitting: *Deleted

## 2015-08-09 IMAGING — CR DG KNEE 1-2V*L*
1 series · 2 of 2 positions shown · non-contrast
Comparison: None.

CLINICAL DATA: Postop left total knee arthroplasty.

EXAM:
LEFT KNEE - 1-2 VIEW

[Series 1: ap · 0.17mm/px · 2 of 2 slices shown]
[im 1/2]
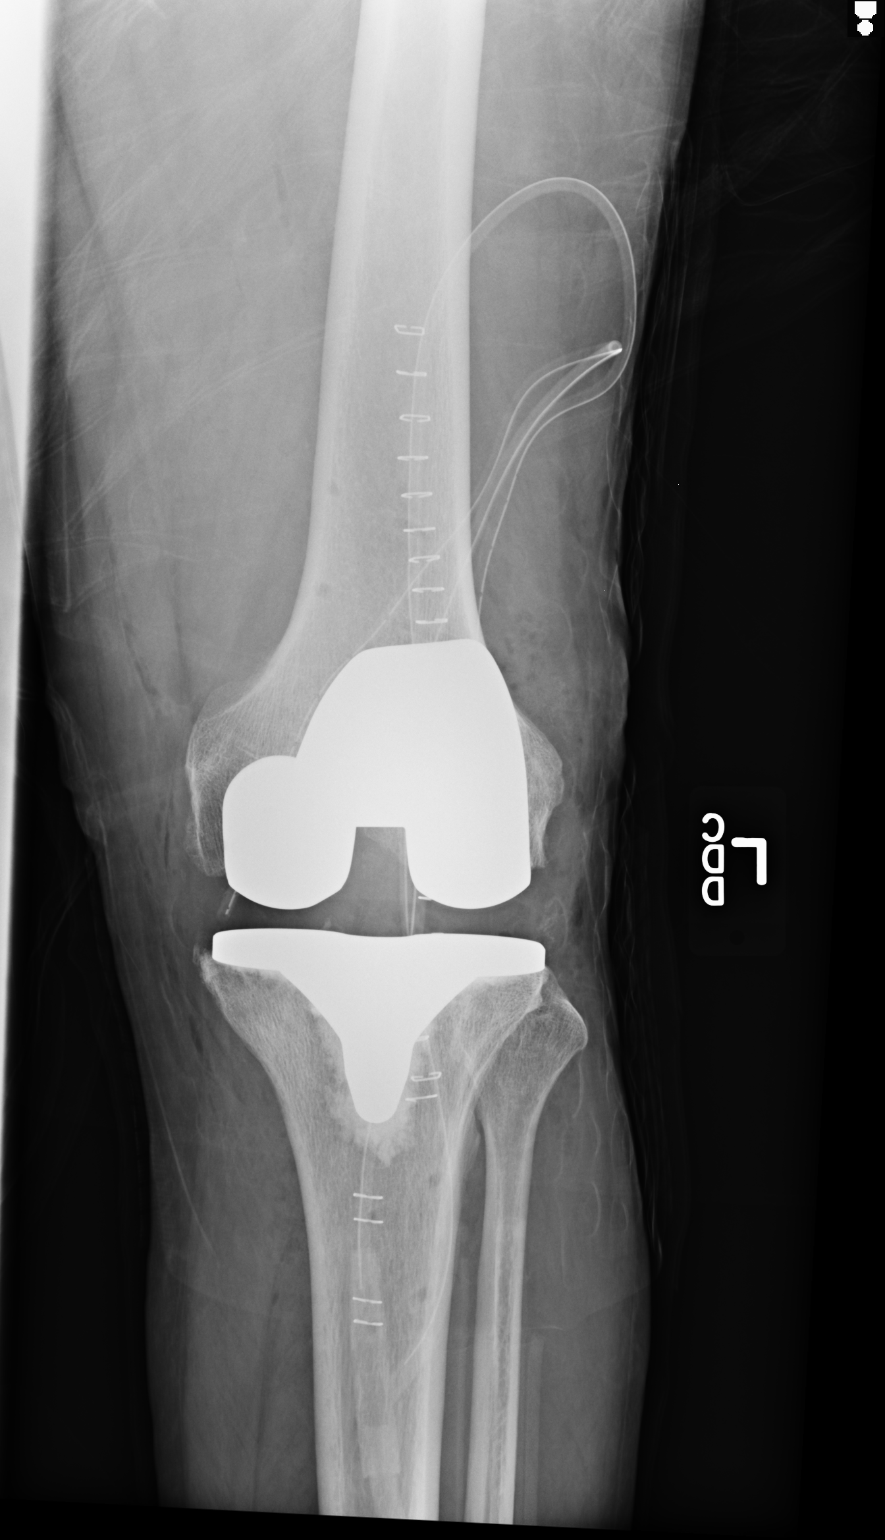
[im 2/2]
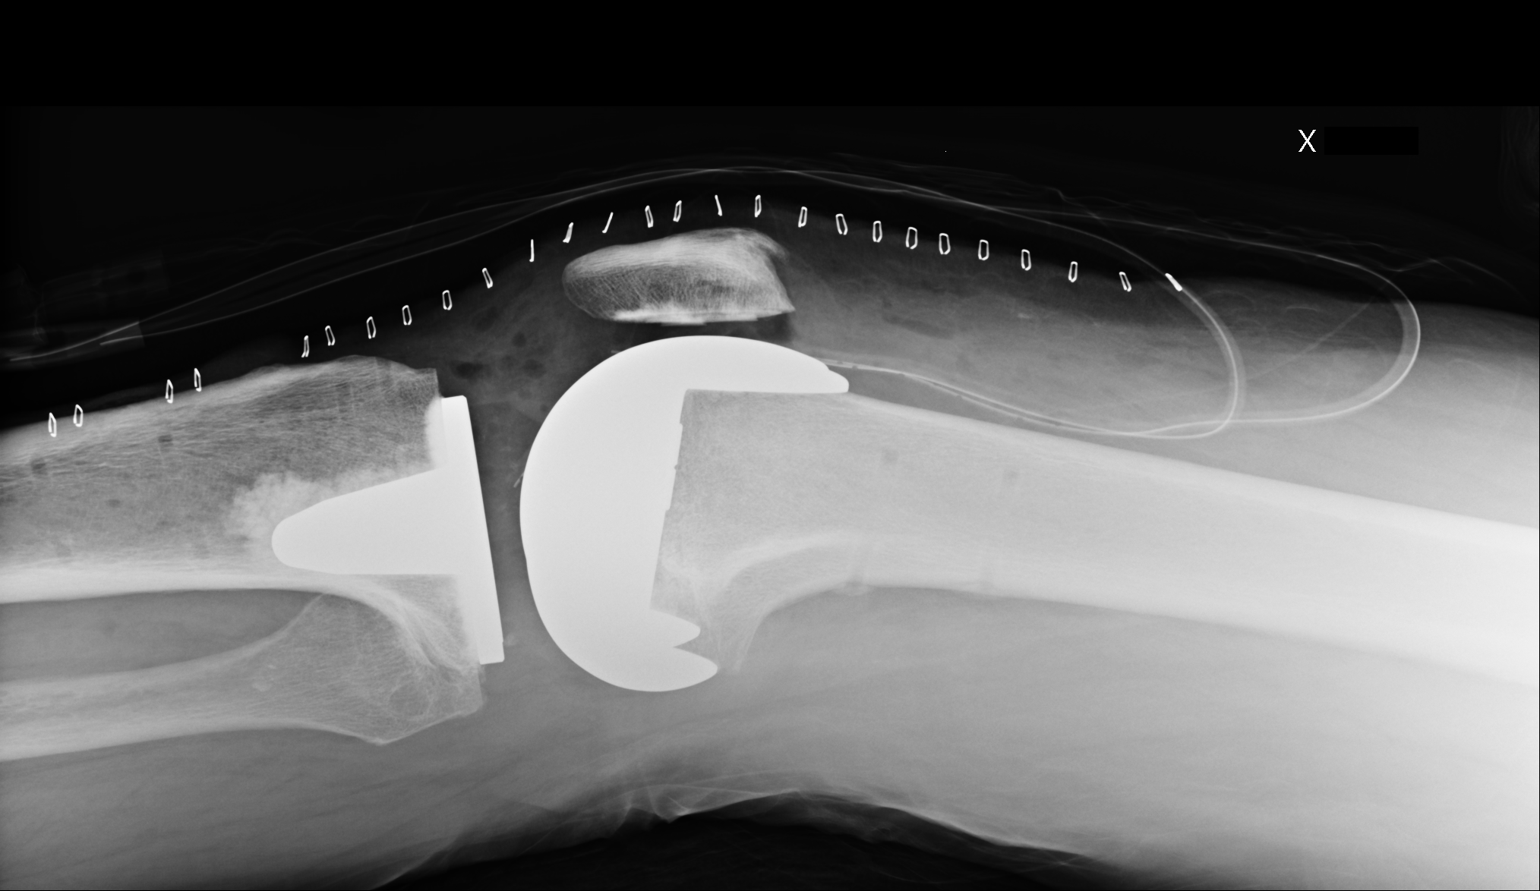

[2 of 2 positions shown; findings below may reference images not displayed]

FINDINGS: Patient is status post left total knee arthroplasty. The hardware
appears well positioned. There is no evidence of acute fracture or
dislocation. Surgical drain, skin staples and soft tissue emphysema
are noted.
IMPRESSION: No demonstrated complication following left total knee arthroplasty.

## 2015-09-14 ENCOUNTER — Ambulatory Visit (INDEPENDENT_AMBULATORY_CARE_PROVIDER_SITE_OTHER): Payer: Medicare Other | Admitting: General Surgery

## 2015-09-14 ENCOUNTER — Encounter: Payer: Self-pay | Admitting: General Surgery

## 2015-09-14 VITALS — BP 138/78 | HR 76 | Resp 14 | Ht 73.0 in | Wt 241.0 lb

## 2015-09-14 DIAGNOSIS — Z86010 Personal history of colonic polyps: Secondary | ICD-10-CM | POA: Diagnosis not present

## 2015-09-14 MED ORDER — POLYETHYLENE GLYCOL 3350 17 GM/SCOOP PO POWD: ORAL | Status: DC

## 2015-09-14 NOTE — Patient Instructions (Addendum)
Colonoscopy A colonoscopy is an exam to look at the entire large intestine (colon). This exam can help find problems such as tumors, polyps, inflammation, and areas of bleeding. The exam takes about 1 hour.  LET [facility] CARE PROVIDER KNOW ABOUT:   Any allergies you have.  All medicines you are taking, including vitamins, herbs, eye drops, creams, and over-the-counter medicines.  Previous problems you or members of your family have had with the use of anesthetics.  Any blood disorders you have.  Previous surgeries you have had.  Medical conditions you have. RISKS AND COMPLICATIONS  Generally, this is a safe procedure. However, as with any procedure, complications can occur. Possible complications include:  Bleeding.  Tearing or rupture of the colon wall.  Reaction to medicines given during the exam.  Infection (rare). BEFORE THE PROCEDURE   Ask your health care provider about changing or stopping your regular medicines.  You may be prescribed an oral bowel prep. This involves drinking a large amount of medicated liquid, starting the day before your procedure. The liquid will cause you to have multiple loose stools until your stool is almost clear or light green. This cleans out your colon in preparation for the procedure.  Do not eat or drink anything else once you have started the bowel prep, unless your health care provider tells you it is safe to do so.  Arrange for someone to drive you home after the procedure. PROCEDURE   You will be given medicine to help you relax (sedative).  You will lie on your side with your knees bent.  A long, flexible tube with a light and camera on the end (colonoscope) will be inserted through the rectum and into the colon. The camera sends video back to a computer screen as it moves through the colon. The colonoscope also releases carbon dioxide gas to inflate the colon. This helps your health care provider see the area better.  During  the exam, your health care provider may take a small tissue sample (biopsy) to be examined under a microscope if any abnormalities are found.  The exam is finished when the entire colon has been viewed. AFTER THE PROCEDURE   Do not drive for 24 hours after the exam.  You may have a small amount of blood in your stool.  You may pass moderate amounts of gas and have mild abdominal cramping or bloating. This is caused by the gas used to inflate your colon during the exam.  Ask when your test results will be ready and how you will get your results. Make sure you get your test results.   This information is not intended to replace advice given to you by your health care provider. Make sure you discuss any questions you have with your health care provider.   Document Released: 04/20/2000 Document Revised: 02/11/2013 Document Reviewed: 12/29/2012 Elsevier Interactive Patient Education [employer].  Patient has been scheduled for a colonoscopy on 10-05-15 at [facility]. It is okay for patient to continue 81 mg aspirin once daily.

## 2015-09-14 NOTE — Progress Notes (Signed)
Patient ID: Jerry Walls, male   DOB: 05-02-1946, 70 y.o.   MRN: [id number]  Chief Complaint  Patient presents with  . Colonoscopy    HPI Jerry Walls is a 70 y.o. male.  Here today for a evaluation of a screening colonoscopy. Denies any gastrintestional issues at this time. Bowels are regular and no bleeding. I personally reviewed the patient's history. HPI  Past Medical History  Diagnosis Date  . Hypertension 2009  . Arthritis   . Personal history of colonic polyps     Past Surgical History  Procedure Laterality Date  . Colonoscopy  2004, 2007, 2012    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 Tubular adenomas without dysplasia in 2007 and 2012.  Marland Kitchen Knee surgery  2005, 2015    Right 05/2013 and Left 12/201 5 Dr. Marry Guan    No family history on file.  Social History Social History  Substance Use Topics  . Smoking status: Former Smoker -- 1.00 packs/day for 30 years    Quit date: 05/07/2013  . Smokeless tobacco: Never Used  . Alcohol Use: 0.0 oz/week    0 Standard drinks or equivalent per week     Comment: 3-4/day    No Known Allergies  Current Outpatient Prescriptions  Medication Sig Dispense Refill  . allopurinol (ZYLOPRIM) 100 MG tablet TK 1 T PO QD  4  . aspirin 81 MG tablet Take 81 mg by mouth daily.    . irbesartan-hydrochlorothiazide (AVALIDE) 300-12.5 MG tablet TK 1 T PO QD  4  . Magnesium 400 MG TABS Take by mouth daily.    . Multiple Vitamins-Minerals (CENTRUM SILVER ULTRA MENS PO) Take by mouth daily.    . polyethylene glycol powder (GLYCOLAX/MIRALAX) powder 255 grams one bottle for colonoscopy prep 255 g 0   No current facility-administered medications for this visit.    Review of Systems Review of Systems  Constitutional: Negative.   Respiratory: Negative.   Cardiovascular: Negative.     Blood pressure 138/78, pulse 76, resp. rate 14, height 6\' 1"  (1.854 m), weight 241 lb (109.317 kg).  Physical Exam Physical Exam   Constitutional: He is oriented to person, place, and time. He appears well-developed and well-nourished.  Eyes: Conjunctivae are normal. No scleral icterus.  Neck: Neck supple.  Cardiovascular: Normal rate, regular rhythm and normal heart sounds.   Pulmonary/Chest: Effort normal. He has wheezes in the right lower field.  Abdominal: Soft. Bowel sounds are normal.  Lymphadenopathy:    He has no cervical adenopathy.  Neurological: He is alert and oriented to person, place, and time.  Skin: Skin is warm and dry.    Data Reviewed 2004 colonoscopy showed a 1.2 cm adenomatous polyp with severe dysplasia in the descending/sigmoid junction. 10 mm negative margin. Adenomatous polyp of the transverse colon.  2007 colonoscopy showed a tubular adenoma of the descending colon.  2012 colonoscopy showed a tubular adenoma of the transverse colon.  Assessment    History multiple colonic polyps.    Plan    Patient has been scheduled for a colonoscopy on 10-05-15 at [facility].  It is okay for patient to continue 81 mg aspirin once daily.     Colonoscopy with possible biopsy/polypectomy prn: Information regarding the procedure, including its potential risks and complications (including but not limited to perforation of the bowel, which may require emergency surgery to repair, and bleeding) was verbally given to the patient. Educational information regarding lower intestinal endoscopy was given to the patient. Written instructions for  how to complete the bowel prep using Miralax were provided. The importance of drinking ample fluids to avoid dehydration as a result of the prep emphasized.   This information has been scribed by Verlene Mayer, CMA    PCP: Dr Annett Fabian, [place] Gleason 09/15/2015, 4:01 PM

## 2015-09-15 ENCOUNTER — Encounter: Payer: Self-pay | Admitting: General Surgery

## 2015-09-15 DIAGNOSIS — Z86010 Personal history of colonic polyps: Secondary | ICD-10-CM | POA: Insufficient documentation

## 2015-09-15 NOTE — Addendum Note (Signed)
Addended by: Robert Bellow on: 09/15/2015 04:05 PM   Modules accepted: Orders

## 2015-09-15 NOTE — H&P (Signed)
HPI  Jerry Walls is a 70 y.o. male. Here today for a evaluation of a screening colonoscopy. Denies any gastrintestional issues at this time. Bowels are regular and no bleeding.  I personally reviewed the patient's history.  HPI  Past Medical History   Diagnosis  Date   .  Hypertension  2009   .  Arthritis    .  Personal history of colonic polyps     Past Surgical History   Procedure  Laterality  Date   .  Colonoscopy   2004, 2007, 2012     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 Tubular adenomas without dysplasia in 2007 and 2012.   Marland Kitchen  Knee surgery   2005, 2015     Right 05/2013 and Left 12/201 5 Dr. Marry Guan    No family history on file.  Social History  Social History   Substance Use Topics   .  Smoking status:  Former Smoker -- 1.00 packs/day for 30 years     Quit date:  05/07/2013   .  Smokeless tobacco:  Never Used   .  Alcohol Use:  0.0 oz/week     0 Standard drinks or equivalent per week      Comment: 3-4/day    No Known Allergies  Current Outpatient Prescriptions   Medication  Sig  Dispense  Refill   .  allopurinol (ZYLOPRIM) 100 MG tablet  TK 1 T PO QD   4   .  aspirin 81 MG tablet  Take 81 mg by mouth daily.     .  irbesartan-hydrochlorothiazide (AVALIDE) 300-12.5 MG tablet  TK 1 T PO QD   4   .  Magnesium 400 MG TABS  Take by mouth daily.     .  Multiple Vitamins-Minerals (CENTRUM SILVER ULTRA MENS PO)  Take by mouth daily.     .  polyethylene glycol powder (GLYCOLAX/MIRALAX) powder  255 grams one bottle for colonoscopy prep  255 g  0    No current facility-administered medications for this visit.    Review of Systems  Review of Systems  Constitutional: Negative.  Respiratory: Negative.  Cardiovascular: Negative.   Blood pressure 138/78, pulse 76, resp. rate 14, height 6\' 1"  (1.854 m), weight 241 lb (109.317 kg).  Physical Exam  Physical Exam  Constitutional: He is oriented to person, place, and time. He appears well-developed and  well-nourished.  Eyes: Conjunctivae are normal. No scleral icterus.  Neck: Neck supple.  Cardiovascular: Normal rate, regular rhythm and normal heart sounds.  Pulmonary/Chest: Effort normal. He has wheezes in the right lower field.  Abdominal: Soft. Bowel sounds are normal.  Lymphadenopathy:  He has no cervical adenopathy.  Neurological: He is alert and oriented to person, place, and time.  Skin: Skin is warm and dry.   Data Reviewed  2004 colonoscopy showed a 1.2 cm adenomatous polyp with severe dysplasia in the descending/sigmoid junction. 10 mm negative margin. Adenomatous polyp of the transverse colon.  2007 colonoscopy showed a tubular adenoma of the descending colon.  2012 colonoscopy showed a tubular adenoma of the transverse colon.  Assessment   History multiple colonic polyps.   Plan   Patient has been scheduled for a colonoscopy on 10-05-15 at [facility]. It is okay for patient to continue 81 mg aspirin once daily.   Colonoscopy with possible biopsy/polypectomy prn: Information regarding the procedure, including its potential risks and complications (including but not limited to perforation of the bowel, which may require emergency surgery  to repair, and bleeding) was verbally given to the patient. Educational information regarding lower intestinal endoscopy was given to the patient. Written instructions for how to complete the bowel prep using Miralax were provided. The importance of drinking ample fluids to avoid dehydration as a result of the prep emphasized.  This information has been scribed by Verlene Mayer, CMA  PCP: Dr Annett Fabian, [place] Gleason  09/15/2015, 4:01 PM

## 2015-10-05 ENCOUNTER — Encounter: Payer: Self-pay | Admitting: Anesthesiology

## 2015-10-05 ENCOUNTER — Ambulatory Visit: Payer: Medicare Other | Admitting: Anesthesiology

## 2015-10-05 ENCOUNTER — Encounter
Admission: RE | Disposition: A | Discharge status: Home / Self Care | Payer: Self-pay | Source: Ambulatory Visit | Attending: General Surgery

## 2015-10-05 ENCOUNTER — Ambulatory Visit
Admission: RE | Admit: 2015-10-05 | Discharge: 2015-10-05 | Disposition: A | Discharge status: Home / Self Care | Payer: Medicare Other | Source: Ambulatory Visit | Attending: General Surgery | Admitting: General Surgery

## 2015-10-05 DIAGNOSIS — M1990 Unspecified osteoarthritis, unspecified site: Secondary | ICD-10-CM | POA: Insufficient documentation

## 2015-10-05 DIAGNOSIS — Z1211 Encounter for screening for malignant neoplasm of colon: Secondary | ICD-10-CM | POA: Diagnosis not present

## 2015-10-05 DIAGNOSIS — Z860100 Personal history of colon polyps, unspecified: Secondary | ICD-10-CM

## 2015-10-05 DIAGNOSIS — K635 Polyp of colon: Secondary | ICD-10-CM | POA: Insufficient documentation

## 2015-10-05 DIAGNOSIS — Z87891 Personal history of nicotine dependence: Secondary | ICD-10-CM | POA: Diagnosis not present

## 2015-10-05 DIAGNOSIS — D125 Benign neoplasm of sigmoid colon: Secondary | ICD-10-CM | POA: Diagnosis not present

## 2015-10-05 HISTORY — PX: COLONOSCOPY WITH PROPOFOL: SHX5780

## 2015-10-05 SURGERY — COLONOSCOPY WITH PROPOFOL
Anesthesia: General

## 2015-10-05 MED ORDER — PROPOFOL 10 MG/ML IV BOLUS
INTRAVENOUS | Status: DC | PRN
  Administered 2015-10-05: 50 mg via INTRAVENOUS

## 2015-10-05 MED ORDER — SODIUM CHLORIDE 0.9 % IV SOLN
INTRAVENOUS | Status: DC
  Administered 2015-10-05: 1000 mL via INTRAVENOUS

## 2015-10-05 MED ORDER — PROPOFOL 500 MG/50ML IV EMUL
INTRAVENOUS | Status: DC | PRN
  Administered 2015-10-05: 170 ug/kg/min via INTRAVENOUS

## 2015-10-05 MED ORDER — LIDOCAINE HCL (CARDIAC) 20 MG/ML IV SOLN
INTRAVENOUS | Status: DC | PRN
  Administered 2015-10-05: 40 mg via INTRAVENOUS

## 2015-10-05 MED ORDER — MIDAZOLAM HCL 2 MG/2ML IJ SOLN
INTRAMUSCULAR | Status: DC | PRN
  Administered 2015-10-05: 1 mg via INTRAVENOUS

## 2015-10-05 MED ORDER — FENTANYL CITRATE (PF) 100 MCG/2ML IJ SOLN
INTRAMUSCULAR | Status: DC | PRN
  Administered 2015-10-05: 50 ug via INTRAVENOUS

## 2015-10-05 NOTE — Anesthesia Procedure Notes (Signed)
Date/Time: 10/05/2015 9:17 AM Performed by: Doreen Salvage Pre-anesthesia Checklist: Patient identified, Emergency Drugs available, Suction available and Patient being monitored Patient Re-evaluated:Patient Re-evaluated prior to inductionOxygen Delivery Method: Nasal cannula Intubation Type: IV induction Dental Injury: Teeth and Oropharynx as per pre-operative assessment  Comments: Nasal cannula with etCO2 monitoring

## 2015-10-05 NOTE — Anesthesia Postprocedure Evaluation (Signed)
Anesthesia Post Note  Patient: Jerry Walls  Procedure(s) Performed: Procedure(s) (LRB): COLONOSCOPY WITH PROPOFOL (N/A)  Patient location during evaluation: Endoscopy Anesthesia Type: General Level of consciousness: awake and alert Pain management: pain level controlled Vital Signs Assessment: post-procedure vital signs reviewed and stable Respiratory status: spontaneous breathing, nonlabored ventilation, respiratory function stable and patient connected to nasal cannula oxygen Cardiovascular status: blood pressure returned to baseline and stable Postop Assessment: no signs of nausea or vomiting Anesthetic complications: no    Last Vitals:  Filed Vitals:   10/05/15 1000 10/05/15 1010  BP: 151/91 163/82  Pulse: 66 58  Temp:    Resp: 13 12    Last Pain: There were no vitals filed for this visit.               Precious Haws Natsuko Kelsay

## 2015-10-05 NOTE — Transfer of Care (Signed)
Immediate Anesthesia Transfer of Care Note  Patient: Jerry Walls  Procedure(s) Performed: Procedure(s): COLONOSCOPY WITH PROPOFOL (N/A)  Patient Location: PACU and Endoscopy Unit  Anesthesia Type:General  Level of Consciousness: sedated  Airway & Oxygen Therapy: Patient Spontanous Breathing and Patient connected to nasal cannula oxygen  Post-op Assessment: Report given to RN and Post -op Vital signs reviewed and stable  Post vital signs: Reviewed and stable  Last Vitals:  Filed Vitals:   10/05/15 0940 10/05/15 0949  BP: 144/72 144/72  Pulse: 70 70  Temp: 36.3 C 36.3 C  Resp: 16 13    Complications: No apparent anesthesia complications

## 2015-10-05 NOTE — Op Note (Signed)
[facility] Gastroenterology Patient Name: Jerry Walls Procedure Date: 10/05/2015 9:16 AM MRN: [id number] Account #: [id number] Date of Birth: April 02, 1946 Admit Type: Outpatient Age: 70 Room: [facility] ENDO ROOM 1 Gender: Male Note Status: Finalized Procedure:            Colonoscopy Indications:          Screening for colorectal malignant neoplasm Providers:            Robert Bellow, MD Referring MD:         Leona Carry. Hall Busing, MD (Referring MD) Medicines:            Monitored Anesthesia Care Complications:        No immediate complications. Procedure:            Pre-Anesthesia Assessment:                       - Prior to the procedure, a History and Physical was                        performed, and patient medications, allergies and                        sensitivities were reviewed. The patient's tolerance of                        previous anesthesia was reviewed.                       - The risks and benefits of the procedure and the                        sedation options and risks were discussed with the                        patient. All questions were answered and informed                        consent was obtained.                       After obtaining informed consent, the colonoscope was                        passed under direct vision. Throughout the procedure,                        the patient's blood pressure, pulse, and oxygen                        saturations were monitored continuously. The Olympus                        PCF-160AL Colonoscope (S# [id number]) was introduced                        through the anus and advanced to the the cecum,                        identified by appendiceal orifice and ileocecal valve.  The colonoscopy was performed without difficulty. The                        patient tolerated the procedure well. The quality of                        the bowel preparation was excellent. Findings:  A 5 mm polyp was found in the sigmoid colon. The polyp was sessile.       Biopsies were taken with a cold forceps for histology.      The retroflexed view of the distal rectum and anal verge was normal and       showed no anal or rectal abnormalities. Impression:           - One 5 mm polyp in the sigmoid colon. Biopsied.                       - The distal rectum and anal verge are normal on                        retroflexion view. Recommendation:       - Telephone endoscopist for pathology results in 1 week. Procedure Code(s):    --- Professional ---                       [phone], Colonoscopy, flexible; with biopsy, single or                        multiple Diagnosis Code(s):    --- Professional ---                       Z12.11, Encounter for screening for malignant neoplasm                        of colon                       D12.5, Benign neoplasm of sigmoid colon CPT copyright 2016 [employer] Association. All rights reserved. The codes documented in this report are preliminary and upon coder review may  be revised to meet current compliance requirements. Robert Bellow, MD 10/05/2015 9:45:04 AM This report has been signed electronically. Number of Addenda: 0 Note Initiated On: 10/05/2015 9:16 AM Scope Withdrawal Time: 0 hours 11 minutes 53 seconds  Total Procedure Duration: 0 hours 18 minutes 39 seconds       [facility]

## 2015-10-05 NOTE — H&P (Signed)
No change in clinical history. For screening colonoscopy.

## 2015-10-05 NOTE — Anesthesia Preprocedure Evaluation (Signed)
Anesthesia Evaluation  Patient identified by MRN, date of birth, ID band Patient awake    Reviewed: Allergy & Precautions, H&P , NPO status , Patient's Chart, lab work & pertinent test results  History of Anesthesia Complications Negative for: history of anesthetic complications  Airway Mallampati: II  TM Distance: <3 FB Neck ROM: limited    Dental  (+) Poor Dentition, Chipped, Caps   Pulmonary neg shortness of breath, sleep apnea and Continuous Positive Airway Pressure Ventilation , former smoker,    Pulmonary exam normal breath sounds clear to auscultation       Cardiovascular Exercise Tolerance: Good hypertension, (-) angina(-) Past MI and (-) DOE Normal cardiovascular exam Rhythm:regular Rate:Normal     Neuro/Psych negative neurological ROS  negative psych ROS   GI/Hepatic negative GI ROS, Neg liver ROS, neg GERD  ,  Endo/Other  negative endocrine ROS  Renal/GU negative Renal ROS  negative genitourinary   Musculoskeletal   Abdominal   Peds  Hematology negative hematology ROS (+)   Anesthesia Other Findings Past Medical History:   Hypertension                                    2009         Arthritis                                                    Personal history of colonic polyps                          Past Surgical History:   COLONOSCOPY                                      2004, 200*     Comment: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               Tubular adenomas without dysplasia in 2007 and               2012.   KNEE SURGERY                                     2005, 2015     Comment:Right 05/2013 and Left 12/201 5 Dr. Marry Guan  BMI    Body Mass Index   32.54 kg/m 2      Reproductive/Obstetrics negative OB ROS                             Anesthesia Physical Anesthesia Plan  ASA: III  Anesthesia Plan: General   Post-op Pain Management:    Induction:    Airway Management Planned:   Additional Equipment:   Intra-op Plan:   Post-operative Plan:   Informed Consent: I have reviewed the patients History and Physical, chart, labs and discussed the procedure including the risks, benefits and alternatives for the proposed anesthesia with the patient or authorized representative who has indicated his/her understanding and acceptance.   Dental Advisory Given  Plan Discussed with: Anesthesiologist, CRNA and Surgeon  Anesthesia  Plan Comments:         Anesthesia Quick Evaluation

## 2015-10-06 ENCOUNTER — Encounter: Payer: Self-pay | Admitting: General Surgery

## 2015-10-06 ENCOUNTER — Telehealth: Payer: Self-pay | Admitting: *Deleted

## 2015-10-06 LAB — SURGICAL PATHOLOGY

## 2015-10-06 NOTE — Telephone Encounter (Signed)
The patient is aware to call back for any questions or concerns.  

## 2015-10-06 NOTE — Telephone Encounter (Signed)
-----   Message from Robert Bellow, MD sent at 10/06/2015 10:50 AM EDT ----- Please notify the patient that the polyp removed at yesterday's colonoscopy was just fine. He should plan on a repeat exam in 10 years. Thank yo ----- Message -----    From: [facility] Interface    Sent: 10/06/2015   9:42 AM      To: Robert Bellow, MD

## 2015-10-17 ENCOUNTER — Encounter: Payer: Self-pay | Admitting: General Surgery

## 2015-11-02 ENCOUNTER — Encounter: Payer: Self-pay | Admitting: General Surgery

## 2020-05-03 ENCOUNTER — Ambulatory Visit: Payer: Medicare Other | Admitting: Family Medicine

## 2020-05-03 ENCOUNTER — Encounter: Payer: Self-pay | Admitting: Family Medicine

## 2020-05-03 ENCOUNTER — Other Ambulatory Visit: Payer: Self-pay

## 2020-05-03 VITALS — BP 159/67 | HR 73 | Ht 72.0 in | Wt 252.0 lb

## 2020-05-03 DIAGNOSIS — Z024 Encounter for examination for driving license: Secondary | ICD-10-CM

## 2020-05-03 NOTE — Progress Notes (Signed)
Subjective:    Patient ID: Jerry Walls, male    DOB: 08/05/1945, 74 y.o.   MRN: [id number]  Jerry Walls is a 74 y.o. male presenting on 05/03/2020 for Employment Physical (DOT Physical)   HPI  Patient presents to clinic for DOT PE  No flowsheet data found.  Social History   Tobacco Use  . Smoking status: Former Smoker    Packs/day: 1.00    Years: 30.00    Pack years: 30.00    Quit date: 05/07/2013    Years since quitting: 6.9  . Smokeless tobacco: Never Used  Substance Use Topics  . Alcohol use: Yes    Alcohol/week: 0.0 standard drinks    Comment: 3-4/day  . Drug use: No    Review of Systems  Constitutional: Negative.   HENT: Negative.   Eyes: Negative.   Respiratory: Negative.   Cardiovascular: Negative.   Gastrointestinal: Negative.   Endocrine: Negative.   Genitourinary: Negative.   Musculoskeletal: Negative.   Skin: Negative.   Allergic/Immunologic: Negative.   Neurological: Negative.   Hematological: Negative.   Psychiatric/Behavioral: Negative.    Per HPI unless specifically indicated above     Objective:    BP (!) 159/67 (BP Location: Right Arm, Patient Position: Sitting, Cuff Size: Large)   Pulse 73   Ht 6' (1.829 m)   Wt 252 lb (114.3 kg)   BMI 34.18 kg/m   Wt Readings from Last 3 Encounters:  05/03/20 252 lb (114.3 kg)  10/05/15 240 lb (108.9 kg)  09/14/15 241 lb (109.3 kg)    Physical Exam Vitals reviewed.  Constitutional:      General: He is not in acute distress.    Appearance: Normal appearance. He is well-developed and well-groomed. He is obese. He is not ill-appearing or toxic-appearing.  HENT:     Head: Normocephalic.     Right Ear: Tympanic membrane, ear canal and external ear normal. There is no impacted cerumen.     Left Ear: Tympanic membrane, ear canal and external ear normal. There is no impacted cerumen.     Nose: Nose normal. No congestion or rhinorrhea.     Mouth/Throat:     Mouth: Mucous membranes are  moist.     Pharynx: Oropharynx is clear. No oropharyngeal exudate or posterior oropharyngeal erythema.  Eyes:     General: Lids are normal. Vision grossly intact. No scleral icterus.       Right eye: No discharge.        Left eye: No discharge.     Extraocular Movements: Extraocular movements intact.     Conjunctiva/sclera: Conjunctivae normal.     Pupils: Pupils are equal, round, and reactive to light.  Cardiovascular:     Rate and Rhythm: Normal rate and regular rhythm.     Pulses: Normal pulses.          Dorsalis pedis pulses are 2+ on the right side and 2+ on the left side.     Heart sounds: Normal heart sounds. No murmur heard. No friction rub. No gallop.   Pulmonary:     Effort: Pulmonary effort is normal. No respiratory distress.     Breath sounds: Normal breath sounds. No wheezing, rhonchi or rales.  Abdominal:     General: Abdomen is flat. Bowel sounds are normal. There is no distension.     Palpations: Abdomen is soft. There is no hepatomegaly, splenomegaly or mass.     Tenderness: There is no abdominal tenderness. There is no right  CVA tenderness, left CVA tenderness, guarding or rebound.     Hernia: No hernia is present.  Musculoskeletal:        General: Normal range of motion.     Cervical back: Normal range of motion and neck supple. No rigidity or tenderness.     Right lower leg: No edema.     Left lower leg: No edema.     Comments: Normal tone, 5/5 strength BUE & BLE  Feet:     Right foot:     Skin integrity: Skin integrity normal.     Left foot:     Skin integrity: Skin integrity normal.  Lymphadenopathy:     Cervical: No cervical adenopathy.  Skin:    General: Skin is warm and dry.     Capillary Refill: Capillary refill takes less than 2 seconds.  Neurological:     General: No focal deficit present.     Mental Status: He is alert and oriented to person, place, and time.     Cranial Nerves: Cranial nerves are intact. No cranial nerve deficit.     Sensory:  Sensation is intact. No sensory deficit.     Motor: Motor function is intact. No weakness.     Coordination: Coordination is intact. Coordination normal.     Gait: Gait is intact. Gait normal.     Deep Tendon Reflexes: Reflexes are normal and symmetric. Reflexes normal.  Psychiatric:        Attention and Perception: Attention and perception normal.        Mood and Affect: Mood and affect normal.        Speech: Speech normal.        Behavior: Behavior normal. Behavior is cooperative.        Thought Content: Thought content normal.        Cognition and Memory: Cognition and memory normal.        Judgment: Judgment normal.    Results for orders placed or performed during the hospital encounter of 10/05/15  Surgical pathology  Result Value Ref Range   SURGICAL PATHOLOGY      Surgical Pathology CASE: [id number] PATIENT: [facility] Surgical Pathology Report     SPECIMEN SUBMITTED: A. Colon polyp, sigmoid; cbx  CLINICAL HISTORY: None provided  PRE-OPERATIVE DIAGNOSIS: HX colon polyp  POST-OPERATIVE DIAGNOSIS: Colon polyp     DIAGNOSIS: A. COLON POLYP, SIGMOID; COLD BIOPSY: - HYPERPLASTIC POLYP. - NEGATIVE FOR DYSPLASIA AND MALIGNANCY.   GROSS DESCRIPTION:  A. Labeled: C BX polyp sigmoid  Tissue fragment(s): 3  Size: 0.2-0.4 cm  Description: pink-tan  Entirely submitted in 1 cassette(s).    Final Diagnosis performed by Quay Burow, MD.  Electronically signed 10/06/2015 9:26:55AM    The electronic signature indicates that the named Attending Pathologist has evaluated the specimen  Technical component performed at [facility], [address], [place], [place] [address] Lab: [phone] Dir: Darrick Penna. Evette Doffing, MD  Professional component performed at [facility], [facility], [place], [place], [place] [address] Lab: [phone] Dir: Dellia Nims. 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  Follow up plan: No follow-ups on file.   Harlin Rain, [place] [facility] Nurse Practitioner [facility] 05/03/2020, 3:11 PM

## 2020-05-03 NOTE — Assessment & Plan Note (Signed)
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
